# Patient Record
Sex: Male | Born: 1975 | Race: White | Hispanic: No | Smoking: Former smoker
Health system: Southern US, Community
[De-identification: ages and names within clinical notes are randomized; demographics above are authoritative.]

## PROBLEM LIST (undated history)

## (undated) DIAGNOSIS — F32A Depression, unspecified: Secondary | ICD-10-CM

## (undated) DIAGNOSIS — F329 Major depressive disorder, single episode, unspecified: Secondary | ICD-10-CM

## (undated) DIAGNOSIS — G4700 Insomnia, unspecified: Secondary | ICD-10-CM

## (undated) HISTORY — DX: Depression, unspecified: F32.A

## (undated) HISTORY — DX: Insomnia, unspecified: G47.00

## (undated) HISTORY — DX: Major depressive disorder, single episode, unspecified: F32.9

## (undated) HISTORY — PX: TYMPANOSTOMY TUBE PLACEMENT: SHX32

---

## 2018-06-12 ENCOUNTER — Encounter: Payer: Self-pay | Admitting: Emergency Medicine

## 2018-06-12 DIAGNOSIS — F988 Other specified behavioral and emotional disorders with onset usually occurring in childhood and adolescence: Secondary | ICD-10-CM

## 2018-06-12 DIAGNOSIS — G4700 Insomnia, unspecified: Secondary | ICD-10-CM

## 2018-06-26 ENCOUNTER — Ambulatory Visit: Payer: Commercial Managed Care - PPO | Admitting: Psychiatry

## 2018-06-26 ENCOUNTER — Encounter: Payer: Self-pay | Admitting: Psychiatry

## 2018-06-26 DIAGNOSIS — F325 Major depressive disorder, single episode, in full remission: Secondary | ICD-10-CM | POA: Diagnosis not present

## 2018-06-26 DIAGNOSIS — F900 Attention-deficit hyperactivity disorder, predominantly inattentive type: Secondary | ICD-10-CM | POA: Diagnosis not present

## 2018-06-26 DIAGNOSIS — R251 Tremor, unspecified: Secondary | ICD-10-CM | POA: Diagnosis not present

## 2018-06-26 NOTE — Progress Notes (Signed)
Steven RossettiRyan Parks [id number] 07/06/1976 42 y.o.  Subjective:   Patient ID:  Steven BaileyRyan Parks is a 42 y.o. (DOB 08/04/1976) male.  Chief Complaint:  Chief Complaint  Patient presents with  . ADHD  . Follow-up    depression and weaning of meds    [facility] to the office today for follow-up of history of major depression and weaning of duloxetine since th last visit in July from 60 mg to 0 about a month ago.  He hasn't noticed any significant mood changes.  Brief downs easily managed and not overwhelming.  Steven HooseCheryl Parks thinks he's doing ok too.    Notices his reactions seem sluggish and that his brain is slower than it should be.  Slow to react for example when his dog was falling.  This seems worse for unknown reasons.  Patient reports stable mood and denies depressed or irritable moods.  Patient denies any recent difficulty with anxiety.  Patient denies difficulty with sleep initiation or maintenance. Denies appetite disturbance.  Patient reports that energy and motivation have been good.  Patient denies any difficulty with concentration.  Patient denies any suicidal ideation.  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   It was initially helpful for his depression but then he lost the benefit.  He states the tremor persisted after discontinuing the Abilify. He then was started on Vraylar 1.5 mg which is the lowest dose in December 2016 which was effective and he took it for approximately 5 months and then he discontinued it..  Of note and his initial visit December 2015 it was noted the he had a blunted affect and that has been present ever since.  It is in fact 1 of the causes of marital distress and conflict that he has had over the years.  Good work function.   Review of Systems:  Review of Systems  Neurological: Positive for  tremors. Negative for dizziness, facial asymmetry, speech difficulty, weakness, light-headedness, numbness and headaches.  Psychiatric/Behavioral: Negative for agitation, behavioral problems, confusion, decreased concentration, dysphoric mood, hallucinations, self-injury, sleep disturbance and suicidal ideas. The patient is not nervous/anxious and is not hyperactive.     Medications: I have reviewed the patient's current medications.  Current Outpatient Medications  Medication Sig Dispense Refill  . zolpidem (AMBIEN) 10 MG tablet Take 10 mg by mouth. 1/2 qhs x 3 days and may increase to 1 qhs     No current facility-administered medications for this visit.     Medication Side Effects: None  Allergies:  Allergies  Allergen Reactions  . Allevess [Capsaicin-Menthol] Rash    History reviewed. No pertinent past medical history.  History reviewed. No pertinent family history.  Social History   Socioeconomic History  . Marital status: Married    Spouse name: Not on file  . Number of children: Not on file  . Years of education: Not on file  . Highest education level: Not on file  Occupational History  . Not on file  Social Needs  . Financial resource strain: Not on file  . Food insecurity:    Worry: Not on file    Inability: Not on file  . Transportation needs:    Medical: Not on file    Non-medical: Not on file  Tobacco Use  . Smoking status: Former Games developermoker  . Smokeless  tobacco: Never Used  Substance and Sexual Activity  . Alcohol use: Not on file  . Drug use: Not on file  . Sexual activity: Not on file  Lifestyle  . Physical activity:    Days per week: Not on file    Minutes per session: Not on file  . Stress: Not on file  Relationships  . Social connections:    Talks on phone: Not on file    Gets together: Not on file    Attends religious service: Not on file    Active member of club or organization: Not on file    Attends meetings of clubs or organizations: Not  on file    Relationship status: Not on file  . Intimate partner violence:    Fear of current or ex partner: Not on file    Emotionally abused: Not on file    Physically abused: Not on file    Forced sexual activity: Not on file  Other Topics Concern  . Not on file  Social History Narrative  . Not on file    Past Medical History, Surgical history, Social history, and Family history were reviewed and updated as appropriate.   Please see review of systems for further details on the patient's review from today.   Objective:   Physical Exam:  There were no vitals taken for this visit.  Physical Exam  Constitutional: He is oriented to person, place, and time. He appears well-developed. No distress.  Musculoskeletal: He exhibits no deformity.  Neurological: He is alert and oriented to person, place, and time. He displays tremor. Coordination and gait normal.  Decreased arm swing.  Tremor on left hand only.  Affect chronically restricted.  Psychiatric: His speech is normal and behavior is normal. Judgment and thought content normal. His mood appears not anxious. His affect is blunt. His affect is not angry, not labile and not inappropriate. Cognition and memory are normal. He does not exhibit a depressed mood. He expresses no homicidal and no suicidal ideation. He expresses no suicidal plans and no homicidal plans.  Insight intact. No auditory or visual hallucinations.  Chronically blunted affect.    Lab Review:  No results found for: NA, K, CL, CO2, GLUCOSE, BUN, CREATININE, CALCIUM, PROT, ALBUMIN, AST, ALT, ALKPHOS, BILITOT, GFRNONAA, GFRAA  No results found for: WBC, RBC, HGB, HCT, PLT, MCV, MCH, MCHC, RDW, LYMPHSABS, MONOABS, EOSABS, BASOSABS  No results found for: POCLITH, LITHIUM   No results found for: PHENYTOIN, PHENOBARB, VALPROATE, CBMZ   .res Assessment: Plan:    Tremor  Attention deficit hyperactivity disorder (ADHD), predominantly inattentive type  Major  depression in complete remission (HCC)  Greater than 50% of face to face time with patient was spent on counseling and coordination of care. We discussed fortunate resolution of his depression and success so far at being off of antidepressants.  We discussed that he could have relapse but that after the first 3 months off of antidepressants the relapse risk rate goes down.  Please contact us if he has recurrence of depression.  I am concerned about the combination of left hand tremor, blunted affect which preexisted any history of atypical antipsychotic treatment, significant decreased arm swing with gait, and overall slowness in movement and thought.  I think he should be evaluated by neurologist for possible early onset Parkinson's disease or some other neurologic condition.  Will refer to Dr. Lurena Joiner Tat.  He agrees to this consultation.  This appointment was 25 minutes  Follow-up here as  needed  Meredith Staggers MD, DFAPA Please see After Visit Summary for patient specific instructions.  No future appointments.  No orders of the defined types were placed in this encounter.     -------------------------------

## 2018-07-01 ENCOUNTER — Encounter: Payer: Self-pay | Admitting: Neurology

## 2018-07-05 NOTE — Progress Notes (Signed)
Steven Parks was seen today in the movement disorders clinic for neurologic consultation at the request of Cottle, Billey Co., MD.  The consultation is for the evaluation of tremor and to r/o PD.  The records that were made available to me were reviewed.    Tremor: Yes.     How long has it been going on? Seem to start after a very brief trial of Abilify in 2016.  Apparently took Abilify for less than 8 weeks.  Was then started on Vraylar in December, 2016 and took that for approximately 5 months  At rest or with activation?  rest  When is it noted the most?  Does affect ability to open soda cap.    Fam hx of tremor?  Yes.  , mother has vocal essential tremor and gets botox for that and mom has head tremor.  Father may have some tremor in the R hand, untreated/undiagnosed  Located where?  L hand/L arm.  Affected by caffeine:  No. (1-2 cups coffee/day)  Affected by alcohol:  Doesn't drink  Affected by stress:  Yes.    Affected by fatigue:  No., but doesn't seem to be present when sleeping  Affects ADL's (tying shoes, brushing teeth, etc):  Yes.   - slower than in the past  Tremor inducing meds:  No. - not currently  Other Specific Symptoms:  Voice: no change Sleep: sleeps well  Vivid Dreams:  No.  Acting out dreams:  No. .  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 Postural symptoms:  No.  Yusuf?  No. Bradykinesia symptoms: no bradykinesia noted Loss of smell:  No. - "always been dull" Loss of taste:  No. Urinary Incontinence:  No. Difficulty Swallowing:  No. Handwriting, micrographia: No. Trouble with ADL's:  No.  Trouble buttoning clothing: Yes.   Depression:  Yes.   but better with Dr. Clovis Pu Memory changes:  Yes.   Hallucinations:  No.  visual distortions: No. N/V:  No. Lightheaded:  No.  Syncope: No. Diplopia:  No. Dyskinesia:  No.  Neuroimaging of the brain has not previously been performed.    PREVIOUS MEDICATIONS: none to date  ALLERGIES:     Allergies  Allergen Reactions  . Allevess [Capsaicin-Menthol] Rash    CURRENT MEDICATIONS:  Outpatient Encounter Medications as of 07/09/2018  Medication Sig  . zolpidem (AMBIEN) 10 MG tablet Take 10 mg by mouth at bedtime as needed.    No facility-administered encounter medications on file as of 07/09/2018.     PAST MEDICAL HISTORY:   Past Medical History:  Diagnosis Date  . Depression   . Insomnia     PAST SURGICAL HISTORY:   Past Surgical History:  Procedure Laterality Date  . TYMPANOSTOMY TUBE PLACEMENT      SOCIAL HISTORY:   Social History   Socioeconomic History  . Marital status: Married    Spouse name: Not on file  . Number of children: Not on file  . Years of education: Not on file  . Highest education level: Not on file  Occupational History  . Not on file  Social Needs  . Financial resource strain: Not on file  . Food insecurity:    Worry: Not on file    Inability: Not on file  . Transportation needs:    Medical: Not on file    Non-medical: Not on file  Tobacco Use  . Smoking status: Former Smoker    Last attempt to quit: 08/14/2002  Years since quitting: 15.9  . Smokeless tobacco: Never Used  Substance and Sexual Activity  . Alcohol use: Not Currently  . Drug use: Never  . Sexual activity: Not on file  Lifestyle  . Physical activity:    Days per week: Not on file    Minutes per session: Not on file  . Stress: Not on file  Relationships  . Social connections:    Talks on phone: Not on file    Gets together: Not on file    Attends religious service: Not on file    Active member of club or organization: Not on file    Attends meetings of clubs or organizations: Not on file    Relationship status: Not on file  . Intimate partner violence:    Fear of current or ex partner: Not on file    Emotionally abused: Not on file    Physically abused: Not on file    Forced sexual activity: Not on file  Other Topics Concern  . Not on file  Social  History Narrative  . Not on file    FAMILY HISTORY:   Family Status  Relation Name Status  . Mother  Alive  . Father  Alive  . Sister  Alive  . Brother  Alive    ROS:  Review of Systems  Constitutional: Negative.   HENT: Negative.   Eyes: Negative.   Respiratory: Negative.   Cardiovascular: Negative.   Gastrointestinal: Negative.   Genitourinary: Negative.   Musculoskeletal: Negative.   Skin: Negative.   Neurological: Negative.   Endo/Heme/Allergies: Negative.     PHYSICAL EXAMINATION:    VITALS:   Vitals:   07/09/18 0940  BP: 130/88  Pulse: 84  SpO2: 98%  Weight: 223 lb (101.2 kg)  Height: '6\' 3"'  (1.905 m)    GEN:  The patient appears stated age and is in NAD. HEENT:  Normocephalic, atraumatic.  The mucous membranes are moist. The superficial temporal arteries are without ropiness or tenderness. CV:  RRR Lungs:  CTAB Neck/HEME:  There are no carotid bruits bilaterally. Skin: scars on L arm from prior cutting  Neurological examination:  Orientation: The patient is alert and oriented x3. Fund of knowledge is appropriate.  Recent and remote memory are intact.  Attention and concentration are normal.    Able to name objects and repeat phrases. Cranial nerves: There is good facial symmetry.  There is mild facial hypomimia with decreased blink.  Pupils are equal round and reactive to light bilaterally. Fundoscopic exam reveals clear margins bilaterally. Extraocular muscles are intact. The visual fields are full to confrontational testing. The speech is fluent and clear. Soft palate rises symmetrically and there is no tongue deviation. Hearing is intact to conversational tone. Sensation: Sensation is intact to light and pinprick throughout (facial, trunk, extremities). Vibration is intact at the bilateral big toe. There is no extinction with double simultaneous stimulation. There is no sensory dermatomal level identified. Motor: Strength is 5/5 in the bilateral upper and  lower extremities.   Shoulder shrug is equal and symmetric.  There is no pronator drift. Deep tendon reflexes: Deep tendon reflexes are 2/4 at the bilateral biceps, triceps, brachioradialis, patella and achilles. Plantar responses are downgoing bilaterally.  Movement examination: Tone: There is mild to moderate increased tone in the left upper and left lower extremities.  There is normal tone in the right upper and lower extremities. Abnormal movements: There is near constant, moderate left upper extremity resting tremor that increases with  distraction procedures. Coordination:  There is  decremation with RAM's, with any form of RAMS, including alternating supination and pronation of the forearm, hand opening and closing, finger taps, heel taps and toe taps on the left.  All rapid alternating movements on the right are normal. Gait and Station: The patient has no difficulty arising out of a deep-seated chair without the use of the hands. The patient's stride length is normal.  The patient has a negative pull test.      Labs: Last lab work was from May, 2018.  White blood cells were 8.0, hemoglobin 15.6, hematocrit 45.1 and platelets 305.  Sodium is 139, potassium 4.3, chloride 98, CO2 25, BUN 14, creatinine 1.1.  AST 26, ALT 43.  ASSESSMENT/PLAN:  1.  Parkinsonism.  I suspect that this does represent idiopathic Parkinson's disease.  Although the patient does have a history of exposure to antipsychotic medications, he has been many years since he was exposed and tardive parkinsonism is very rare.  -We discussed the diagnosis as well as pathophysiology of the disease.  We discussed treatment options as well as prognostic indicators.  Patient education was provided.  -We discussed that it used to be thought that levodopa would increase risk of melanoma but now it is believed that Parkinsons itself likely increases risk of melanoma. he is to get regular skin checks.  -Greater than 50% of the 60 minute  visit was spent in counseling answering questions and talking about what to expect now as well as in the future.  We talked about medication options as well as potential future surgical options.  We talked about safety in the home.  -We will do labs today, including chemistry and TSH   -MRI brain given age of onset  -We discussed community resources in the area including patient support groups and community exercise programs for PD and pt education was provided to the patient.  -Met with Education officer, museum today.  -Offered a second opinion and he will let us know if he would like to take [place] up on that.  He does live quite some distance away, but comes to [facility] for psychiatry.  -Talked in detail about medications.  Ultimately decided on pramipexole ER and will work up to 1.5 mg daily.  Discussed extensively risks, benefits, and side effects which included but were not limited to sleep attacks and compulsive behaviors.  Discussed in detail what compulsive behaviors were.  He is to let me know if he has any side effects with medication.  2.  Depression  -currently well controlled per patient.    -seeing Dr. Clovis Pu  3.  Follow up is anticipated in the next 4 months, sooner should new neurologic issues arise.   Cc:  Patient, No Pcp Per

## 2018-07-09 ENCOUNTER — Encounter: Payer: Self-pay | Admitting: Psychology

## 2018-07-09 ENCOUNTER — Ambulatory Visit: Payer: Self-pay | Admitting: Neurology

## 2018-07-09 ENCOUNTER — Encounter: Payer: Self-pay | Admitting: Neurology

## 2018-07-09 ENCOUNTER — Other Ambulatory Visit (INDEPENDENT_AMBULATORY_CARE_PROVIDER_SITE_OTHER): Payer: Commercial Managed Care - PPO

## 2018-07-09 ENCOUNTER — Ambulatory Visit: Payer: Commercial Managed Care - PPO | Admitting: Neurology

## 2018-07-09 VITALS — BP 130/88 | HR 84 | Ht 75.0 in | Wt 223.0 lb

## 2018-07-09 DIAGNOSIS — R251 Tremor, unspecified: Secondary | ICD-10-CM

## 2018-07-09 DIAGNOSIS — G20 Parkinson's disease: Secondary | ICD-10-CM

## 2018-07-09 DIAGNOSIS — R278 Other lack of coordination: Secondary | ICD-10-CM

## 2018-07-09 LAB — COMPREHENSIVE METABOLIC PANEL
AG Ratio: 2 (calc) (ref 1.0–2.5)
ALBUMIN MSPROF: 4.9 g/dL (ref 3.6–5.1)
ALT: 30 U/L (ref 9–46)
AST: 20 U/L (ref 10–40)
Alkaline phosphatase (APISO): 71 U/L (ref 40–115)
BUN: 18 mg/dL (ref 7–25)
CO2: 30 mmol/L (ref 20–32)
Calcium: 10.1 mg/dL (ref 8.6–10.3)
Chloride: 102 mmol/L (ref 98–110)
Creat: 0.95 mg/dL (ref 0.60–1.35)
Globulin: 2.4 g/dL (calc) (ref 1.9–3.7)
Glucose, Bld: 83 mg/dL (ref 65–99)
POTASSIUM: 4.2 mmol/L (ref 3.5–5.3)
Sodium: 140 mmol/L (ref 135–146)
TOTAL PROTEIN: 7.3 g/dL (ref 6.1–8.1)
Total Bilirubin: 0.8 mg/dL (ref 0.2–1.2)

## 2018-07-09 LAB — TSH: TSH: 0.73 m[IU]/L (ref 0.40–4.50)

## 2018-07-09 MED ORDER — PRAMIPEXOLE DIHYDROCHLORIDE ER 1.5 MG PO TB24: 1.0000 | ORAL_TABLET | Freq: Every day | ORAL | 2 refills | Status: DC

## 2018-07-09 MED ORDER — PRAMIPEXOLE DIHYDROCHLORIDE 0.125 MG PO TABS: ORAL_TABLET | ORAL | 0 refills | Status: DC

## 2018-07-09 NOTE — Progress Notes (Signed)
I met with the patient when he was in the clinic today.  We talked about resources for people that are diagnosed with young onset Parkinson's disease.  He lives close to the [place] area in [place].  However, he has a brother that lives here in [place] and a mother that lives in [place] as well as a wife.  He reported all of those contacts as his social support system.  He currently works full-time in [employer] and spends a lot of his day at his desk.  He likes to cycle outdoors, but is exploring other exercise options as it is getting colder outside.  He gave me permission to connect him to the [employer] He is interested in connecting.  I want him to get connected and know about what thgey are doing so he can participate especially for the social gatherings.  We talked a little bit about being connected with others who would understand.  In addition, we discussed exercise and resources for starting an exercise program.  I provided him with resources on exercise and Parkinson's disease.  In addition,  I provided him with my contact information should he have any questions or needs in the future.

## 2018-07-09 NOTE — Patient Instructions (Signed)
1. Start mirapex (pramipexole) as follows:  0.125 mg - 1 tablet three times per day for a week, then 2 tablets three times per day for a week and then fill the 1.5 mg tablet and take that, 1 pill daily  2. We have sent a referral to [facility] for your MRI and they will call you directly to schedule your appt. They are located at [address] If you need to contact them directly please call [phone].  3. Your provider has requested that you have labwork completed today. Please go to [facility] ([id number]) on the second floor of this building before leaving the office today. You do not need to check in. If you are not called within 15 minutes please check with the front desk.

## 2018-07-10 ENCOUNTER — Telehealth: Payer: Self-pay | Admitting: Neurology

## 2018-07-10 NOTE — Telephone Encounter (Signed)
Left message on machine for patient to call back.

## 2018-07-10 NOTE — Telephone Encounter (Signed)
-----   Message from [place]endale Chardonika K Patel, DO sent at 07/10/2018 10:45 AM EST ----- Please notify patient lab are within normal limits.  Thank you.

## 2018-07-15 ENCOUNTER — Telehealth: Payer: Self-pay | Admitting: Neurology

## 2018-07-15 DIAGNOSIS — G20 Parkinson's disease: Secondary | ICD-10-CM

## 2018-07-15 NOTE — Telephone Encounter (Signed)
Okay to send referral. 

## 2018-07-15 NOTE — Telephone Encounter (Signed)
Patient called and would like to have a second opinion with [facility]? He would like a referral sent to them. Please Call. Thanks

## 2018-07-15 NOTE — Telephone Encounter (Signed)
ok 

## 2018-07-16 ENCOUNTER — Ambulatory Visit: Payer: Self-pay | Admitting: Neurology

## 2018-07-16 ENCOUNTER — Encounter: Payer: Self-pay | Admitting: Neurology

## 2018-07-16 NOTE — Telephone Encounter (Signed)
Referral made to [facility]. They are booked until April 15, 2019. Letter mailed to patient with details of referral. Records fax[facility] to [facility] to [phone] with confirmation received.

## 2018-07-24 ENCOUNTER — Ambulatory Visit
Admission: RE | Admit: 2018-07-24 | Discharge: 2018-07-24 | Disposition: A | Discharge status: Home / Self Care | Payer: Commercial Managed Care - PPO | Source: Ambulatory Visit | Attending: Neurology | Admitting: Neurology

## 2018-07-24 DIAGNOSIS — R278 Other lack of coordination: Secondary | ICD-10-CM

## 2018-07-24 DIAGNOSIS — G20 Parkinson's disease: Secondary | ICD-10-CM

## 2018-07-24 DIAGNOSIS — R251 Tremor, unspecified: Secondary | ICD-10-CM

## 2018-07-25 ENCOUNTER — Telehealth: Payer: Self-pay | Admitting: Neurology

## 2018-07-25 NOTE — Telephone Encounter (Signed)
-----   Message from Octaviano Battyebecca S Tat, DO sent at 07/25/2018  7:34 AM EST ----- Let pt know that MRI is normal.  Reviewed personally

## 2018-07-25 NOTE — Telephone Encounter (Signed)
Left message on machine for patient to call back.

## 2018-07-25 NOTE — Telephone Encounter (Signed)
Patient called back and was made aware MR okay.

## 2018-08-15 ENCOUNTER — Telehealth: Payer: Self-pay | Admitting: Neurology

## 2018-08-15 NOTE — Telephone Encounter (Signed)
Spoke with patient and he states he feels like the Mirapex is helping, but not enough. Still having a lot of tremor and stiffness. He asked about increasing the dosage. I let him know that tremor is not always controlled by medication, but that Dr. Arbutus Leas would probably want to exam him before increasing the dose. He is currently on Mirapex 1.5 mg 1 QD. He has been on this for several weeks. He has a follow up scheduled at the end of February. Please advise if you would like to see him sooner to address?

## 2018-08-15 NOTE — Telephone Encounter (Signed)
Patient is calling in wanting to review dosages for the Mirapex. Please call him back at [phone]. Thanks!

## 2018-08-15 NOTE — Progress Notes (Deleted)
Steven Parks was seen today in the movement disorders clinic for follow-up of newly diagnosed Parkinson's disease.  He had an MRI of the brain since last visit, which was normal.  I have reviewed that personally.  He was started on pramipexole ER and has worked up to 1.5 mg daily.  He denies any compulsive behaviors.  He denies sleep attacks.  Denies hallucinations.  Denies [place].  He requested a work in appointment today as he wanted to increase the medication, but I wanted to see him first.  Patient reports that he is still having a lot of tremor and stiffness, although he does think that the pramipexole is helping some.  He has requested a second opinion at [facility].  He has an appointment with Dr. Westley Parks, but not until September.  PREVIOUS MEDICATIONS: none to date  ALLERGIES:   Allergies  Allergen Reactions  . Allevess [Capsaicin-Menthol] Rash    CURRENT MEDICATIONS:  Outpatient Encounter Medications as of 08/19/2018  Medication Sig  . pramipexole (MIRAPEX) 0.125 MG tablet 1 tablet three times per day for a week, then 2 tablets three times per day for a week and then fill the 1.5 mg tablet  . Pramipexole Dihydrochloride (MIRAPEX ER) 1.5 MG TB24 Take 1 tablet (1.5 mg total) by mouth daily.  Marland Kitchen. zolpidem (AMBIEN) 10 MG tablet Take 10 mg by mouth at bedtime as needed.    No facility-administered encounter medications on file as of 08/19/2018.     PAST MEDICAL HISTORY:   Past Medical History:  Diagnosis Date  . Depression   . Insomnia     PAST SURGICAL HISTORY:   Past Surgical History:  Procedure Laterality Date  . TYMPANOSTOMY TUBE PLACEMENT      SOCIAL HISTORY:   Social History   Socioeconomic History  . Marital status: Married    Spouse name: Not on file  . Number of children: Not on file  . Years of education: Not on file  . 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 Financial resource strain: Not on  file  . Food insecurity:    Worry: Not on file    Inability: Not on file  . Transportation needs:    Medical: Not on file    Non-medical: Not on file  Tobacco Use  . Smoking status: Former Smoker    Last attempt to quit: 08/14/2002    Years since quitting: 16.0  . Smokeless tobacco: Never Used  Substance and Sexual Activity  . Alcohol use: Not Currently  . Drug use: Never  . Sexual activity: Not on file  Lifestyle  . Physical activity:    Days per week: Not on file    Minutes per session: Not on file  . Stress: Not on file  Relationships  . Social connections:    Talks on phone: Not on file    Gets together: Not on file    Attends religious service: Not on file    Active member of club or organization: Not on file    Attends meetings of clubs or organizations: Not on file    Relationship status: Not on file  . Intimate partner violence:    Fear of current or ex partner: Not on file    Emotionally abused: Not on file    Physically abused: Not on file    Forced sexual activity: Not on file  Other Topics Concern  . Not  on file  Social History Narrative  . Not on file    FAMILY HISTORY:   Family Status  Relation Name Status  . Mother  Alive  . Father  Alive  . Sister  Alive  . Brother  Alive    ROS:  ROS  PHYSICAL EXAMINATION:    VITALS:   There were no vitals filed for this visit.  GEN:  The patient appears stated age and is in NAD. HEENT:  Normocephalic, atraumatic.  The mucous membranes are moist. The superficial temporal arteries are without ropiness or tenderness. CV:  RRR Lungs:  CTAB Neck/HEME:  There are no carotid bruits bilaterally. Skin: scars on L arm from prior cutting  Neurological examination:  Orientation: The patient is alert and oriented x3. Fund of knowledge is appropriate.  Recent and remote memory are intact.  Attention and concentration are normal.    Able to name objects and repeat phrases. Cranial nerves: There is good facial  symmetry.  There is mild facial hypomimia with decreased blink.  Pupils are equal round and reactive to light bilaterally. Fundoscopic exam reveals clear margins bilaterally. Extraocular muscles are intact. The visual fields are full to confrontational testing. The speech is fluent and clear. Soft palate rises symmetrically and there is no tongue deviation. Hearing is intact to conversational tone. Sensation: Sensation is intact to light and pinprick throughout (facial, trunk, extremities). Vibration is intact at the bilateral big toe. There is no extinction with double simultaneous stimulation. There is no sensory dermatomal level identified. Motor: Strength is 5/5 in the bilateral upper and lower extremities.   Shoulder shrug is equal and symmetric.  There is no pronator drift. Deep tendon reflexes: Deep tendon reflexes are 2/4 at the bilateral biceps, triceps, brachioradialis, patella and achilles. Plantar responses are downgoing bilaterally.  Movement examination: Tone: There is mild to moderate increased tone in the left upper and left lower extremities.  There is normal tone in the right upper and lower extremities. Abnormal movements: There is near constant, moderate left upper extremity resting tremor that increases with distraction procedures. Coordination:  There is  decremation with RAM's, with any form of RAMS, including alternating supination and pronation of the forearm, hand opening and closing, finger taps, heel taps and toe taps on the left.  All rapid alternating movements on the right are normal. Gait and Station: The patient has no difficulty arising out of a deep-seated chair without the use of the hands. The patient's stride length is normal.  The patient has a negative pull test.      Labs:    Chemistry      Component Value Date/Time   NA 140 07/09/2018 1055   K 4.2 07/09/2018 1055   CL 102 07/09/2018 1055   CO2 30 07/09/2018 1055   BUN 18 07/09/2018 1055   CREATININE 0.95  07/09/2018 1055      Component Value Date/Time   CALCIUM 10.1 07/09/2018 1055   AST 20 07/09/2018 1055   ALT 30 07/09/2018 1055   BILITOT 0.8 07/09/2018 1055     Lab Results  Component Value Date   TSH 0.73 07/09/2018     ASSESSMENT/PLAN:  1.  Parkinsonism.  I suspect that this does represent idiopathic Parkinson's disease.  Although the patient does have a history of exposure to antipsychotic medications, he has been many years since he was exposed and tardive parkinsonism is very rare.  -***Pramipexole ER  -Talked in detail about medications.  Ultimately decided on pramipexole ER  and will work up to 1.5 mg daily.  Discussed extensively risks, benefits, and side effects which included but were not limited to sleep attacks and compulsive behaviors.  Discussed in detail what compulsive behaviors were.  He is to let me know if he has any side effects with medication.  2.  Depression  -currently well controlled per patient.    -seeing Dr. Jennelle Human  3.  ***   Cc:  Patient, No Pcp Per

## 2018-08-15 NOTE — Telephone Encounter (Signed)
Left message on machine for patient to call back.  Appt made for Monday, awaiting call back to make patient aware and see if he can come this date/time.

## 2018-08-15 NOTE — Telephone Encounter (Signed)
Yes.  Need to re-evaluate him first before changing/adding medication.  There is a f/u open Monday if wants that or can wait until feb appt

## 2018-08-16 NOTE — Telephone Encounter (Signed)
Patient could not come to this appt. He was placed on wait list.

## 2018-08-19 ENCOUNTER — Ambulatory Visit: Payer: Commercial Managed Care - PPO | Admitting: Neurology

## 2018-09-24 NOTE — Progress Notes (Signed)
Steven Parks was seen today in the movement disorders clinic for follow-up of newly diagnosed Parkinson's disease.  This patient is accompanied in the office by his significant other who supplements the history.He was started on Mirapex ER last visit and has worked to 1.5 mg daily.  He doesn't think that it has helped.  He last took his medication last yesterday afternoon.    He has not had [place] since last visit.  No lightheadedness or near syncope.  No compulsive behaviors.  No hallucinations. He is not doing CV exercise but is doing weight resistance exercise.   He has requested a second opinion at [facility], and that referral was provided.  However, his appointment is not until September, 2020.  Patient did have an MRI of the brain since last visit.  I have personally reviewed that.  This was normal.  Reports that mood is at baseline.  States that he was depressed after our last visit, but has since resolved.  PREVIOUS MEDICATIONS: none to date  ALLERGIES:    Allergies  Allergen Reactions  . Allevess [Capsaicin-Menthol] Rash    CURRENT MEDICATIONS:  Outpatient Encounter Medications as of 10/02/2018  Medication Sig  . Pramipexole Dihydrochloride (MIRAPEX ER) 1.5 MG TB24 Take 1 tablet (1.5 mg total) by mouth daily.  . [DISCONTINUED] pramipexole (MIRAPEX) 0.125 MG tablet 1 tablet three times per day for a week, then 2 tablets three times per day for a week and then fill the 1.5 mg tablet  . [DISCONTINUED] zolpidem (AMBIEN) 10 MG tablet Take 10 mg by mouth at bedtime as needed.    No facility-administered encounter medications on file as of 10/02/2018.     PAST MEDICAL HISTORY:   Past Medical History:  Diagnosis Date  . Depression   . Insomnia     PAST SURGICAL HISTORY:   Past Surgical History:  Procedure Laterality Date  . TYMPANOSTOMY TUBE PLACEMENT      SOCIAL HISTORY:   Social History   Socioeconomic History  . Marital status: Married    Spouse name: Not on file  . Number of  children: Not on file  . Years of education: Not on file  . 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 Financial resource strain: Not on file  . Food insecurity:    Worry: Not on file    Inability: Not on file  . Transportation needs:    Medical: Not on file    Non-medical: Not on file  Tobacco Use  . Smoking status: Former Smoker    Last attempt to quit: 08/14/2002    Years since quitting: 16.1  . Smokeless tobacco: Never Used  Substance and Sexual Activity  . Alcohol use: Not Currently  . Drug use: Never  . Sexual activity: Not on file  Lifestyle  . Physical activity:    Days per week: Not on file    Minutes per session: Not on file  . Stress: Not on file  Relationships  . Social connections:    Talks on phone: Not on file    Gets together: Not on file    Attends religious service: Not on file    Active member of club or organization: Not on file    Attends meetings of clubs or organizations: Not on file    Relationship status: Not on file  . Intimate partner violence:    Fear of current or ex partner: Not on  file    Emotionally abused: Not on file    Physically abused: Not on file    Forced sexual activity: Not on file  Other Topics Concern  . Not on file  Social History Narrative  . Not on file    FAMILY HISTORY:   Family Status  Relation Name Status  . Mother  Alive  . Father  Alive  . Sister  Alive  . Brother  Alive    ROS:  Review of Systems  Constitutional: Negative.   HENT: Negative.   Eyes: Negative.   Respiratory: Negative.   Cardiovascular: Negative.   Gastrointestinal: Negative.   Skin: Negative.     PHYSICAL EXAMINATION:    VITALS:   Vitals:   10/02/18 1022  BP: 110/66  Pulse: 64  SpO2: 97%  Weight: 216 lb (98 kg)  Height: 6\' 3"  (1.905 m)    GEN:  The patient appears stated age and is in NAD. HEENT:  Normocephalic, atraumatic.  The mucous membranes are moist.  The superficial temporal arteries are without ropiness or tenderness. CV:  RRR Lungs:  CTAB Neck/HEME:  There are no carotid bruits bilaterally.' Skin: L arm scars from prior cutting  Neurological examination:  Orientation: The patient is alert and oriented x3. Cranial nerves: There is good facial symmetry. The speech is fluent and clear. Soft palate rises symmetrically and there is no tongue deviation. Hearing is intact to conversational tone. Sensation: Sensation is intact to light touch throughout Motor: Strength is 5/5 in the bilateral upper and lower extremities.   Shoulder shrug is equal and symmetric.  There is no pronator drift.  Movement examination: Tone: There is mild increased tone in the left upper and lower extremities.  Normal tone in the right. Abnormal movements: Near constant, moderate left upper extremity resting tremor. Coordination:  There is  decremation with RAM's, with most rapid alternating movements on the left. Gait and Station: The patient has no difficulty arising out of a deep-seated chair without the use of the hands. The patient's stride length is normal.    Labs:   Chemistry      Component Value Date/Time   NA 140 07/09/2018 1055   K 4.2 07/09/2018 1055   CL 102 07/09/2018 1055   CO2 30 07/09/2018 1055   BUN 18 07/09/2018 1055   CREATININE 0.95 07/09/2018 1055      Component Value Date/Time   CALCIUM 10.1 07/09/2018 1055   AST 20 07/09/2018 1055   ALT 30 07/09/2018 1055   BILITOT 0.8 07/09/2018 1055     Lab Results  Component Value Date   TSH 0.73 07/09/2018     ASSESSMENT/PLAN:  1.  Parkinsonism.  I suspect that this does represent idiopathic Parkinson's disease.  Although the patient does have a history of exposure to antipsychotic medications, he has been many years since he was exposed and tardive parkinsonism is very rare.  Discussed with the patient and his girlfriend today that I do not think that this is tardive parkinsonism,  although it could be.  Discussed that we could do a DaTscan, but think that the utility is probably fairly low right now.  -Continue pramipexole ER, 1.5 mg daily.  -After much discussion decided to add carbidopa/levodopa 25/100, 1 tablet 3 times per day.  Discussed extensively risk, benefits, and side effects.  Understanding was expressed.  Willingness to take medication was agreed upon.  -Recommended increased cardiovascular exercise.  -Patient's girlfriend had many questions and I answered them to the  best of my ability.  She asked about him having a potential atypical state.  I do not think that this is the case.  Discussed with her that he does not have any atypical features (early [place], early hallucinations, urinary incontinence, etc.), but it is early on in the disease and these things could develop within the first few years.  I do not think that this is the case, however.  -Patient has requested a second opinion at [facility], and now has an appointment in September, 2020.  I certainly have no objection to that, but I did tell him that I would not recommend him keeping two movement disorder specialists, so he will ultimately need to decide where he wants to stay.  [facility] much closer to his home, as he is driving a long way to come to my office. 2.  Depression  -currently well controlled per patient.    -seeing Dr. Jennelle Humanottle 3.  Follow up is anticipated in the next few months, sooner should new neurologic issues arise.  Much greater than 50% of this visit was spent in counseling and coordinating care.  Total face to face time:  35 min   Cc:  Patient, No Pcp Per

## 2018-10-02 ENCOUNTER — Encounter: Payer: Self-pay | Admitting: Neurology

## 2018-10-02 ENCOUNTER — Encounter

## 2018-10-02 ENCOUNTER — Ambulatory Visit: Payer: Commercial Managed Care - PPO | Admitting: Neurology

## 2018-10-02 VITALS — BP 110/66 | HR 64 | Ht 75.0 in | Wt 216.0 lb

## 2018-10-02 DIAGNOSIS — G20 Parkinson's disease: Secondary | ICD-10-CM | POA: Diagnosis not present

## 2018-10-02 MED ORDER — PRAMIPEXOLE DIHYDROCHLORIDE ER 1.5 MG PO TB24: 1.0000 | ORAL_TABLET | Freq: Every day | ORAL | 1 refills | Status: DC

## 2018-10-02 MED ORDER — CARBIDOPA-LEVODOPA 25-100 MG PO TABS: 1.0000 | ORAL_TABLET | Freq: Three times a day (TID) | ORAL | 1 refills | Status: DC

## 2018-10-02 NOTE — Patient Instructions (Signed)
1. Start Carbidopa Levodopa as follows:  Take 1/2 tablet three times daily, at least 30 minutes before meals, for one week  Then take 1/2 tablet in the morning, 1/2 tablet in the afternoon, 1 tablet in the evening, at least 30 minutes before meals, for one week  Then take 1/2 tablet in the morning, 1 tablet in the afternoon, 1 tablet in the evening, at least 30 minutes before meals, for one week  Then take 1 tablet three times daily, at least 30 minutes before meals  2. Stay on Pramipexole 1.5 mg daily.

## 2018-11-04 ENCOUNTER — Telehealth: Payer: Self-pay | Admitting: Neurology

## 2018-11-04 NOTE — Telephone Encounter (Signed)
Patient has some questions about the Caridopa levodopa dosage please call

## 2018-11-04 NOTE — Telephone Encounter (Signed)
He and I discussed in detail last visit that it may not help tremor (and may have levodopa resistant tremor as do 50% of patients).  I started it because he was rigid.  He is spreading them out a little far though.  I would move them closer together but don't want to increase dosage.

## 2018-11-04 NOTE — Telephone Encounter (Signed)
Patient made aware.

## 2018-11-04 NOTE — Telephone Encounter (Signed)
Spoke with patient. He states that Carbidopa Levodopa is not helping with tremor. I did explain that it is meant to help with other symptoms, not just tremor, but he is very focused on tremor control. He has been on a full dosage for a couple weeks. He is currently taking 1 tablet at 9-10 am, 1 at 3-4 pm, and 1 at 9-10 pm. He is taking his Pramipexole around 9-10 pm. His waking hours are 9-10 am until 12-1 am. I advised him to move dosages to the following times: 10 am, 2pm, and 6 pm. I also advised that Dr. Arbutus Leas doesn't often increase medication to control tremor, but I would send her the message.  Dr. Arbutus Leas - please advise.

## 2019-03-18 NOTE — Progress Notes (Signed)
Steven Parks was seen today in the movement disorders clinic for follow-up of newly diagnosed Parkinson's disease.  This patient is accompanied in the office by his significant other who supplements the history.He was started on Mirapex ER last visit and has worked to 1.5 mg daily.  He doesn't think that it has helped.  He last took his medication last yesterday afternoon.    He has not had Gir[place] since last visit.  No lightheadedness or near syncope.  No compulsive behaviors.  No hallucinations. He is not doing CV exercise but is doing weight resistance exercise.   He has requested a second opinion at [facility], and that referral was provided.  However, his appointment is not until September, 2020.  Patient did have an MRI of the brain since last visit.  I have personally reviewed that.  This was normal.  Reports that mood is at baseline.  States that he was depressed after our last visit, but has since resolved.  03/20/19 update: Patient seen today in follow-up for Parkinson's disease.  He is on pramipexole ER, 1.5 mg daily.  Last visit, we added carbidopa/levodopa 25/100 and worked to 1 tablet 3 times per day.  Feels that he is the same since last visit, including tremor.  Does note that if he misses dosage, tremor and stiffness are worse.  His walking has been good.  He is riding his bike 2 days per week (road) - 15 miles.  He does some weights a few days per week.    PREVIOUS MEDICATIONS: none to date  ALLERGIES:    Allergies  Allergen Reactions  . Allevess [Capsaicin-Menthol] Rash    CURRENT MEDICATIONS:  Outpatient Encounter Medications as of 03/20/2019  Medication Sig  . carbidopa-levodopa (SINEMET IR) 25-100 MG tablet Take 1 tablet by mouth 3 (three) times daily.  . Pramipexole Dihydrochloride (MIRAPEX ER) 1.5 MG TB24 Take 1 tablet (1.5 mg total) by mouth daily.   No facility-administered encounter medications on file as of 03/20/2019.     PAST MEDICAL HISTORY:   Past Medical History:   Diagnosis Date  . Depression   . Insomnia     PAST SURGICAL HISTORY:   Past Surgical History:  Procedure Laterality Date  . TYMPANOSTOMY TUBE PLACEMENT      SOCIAL HISTORY:   Social History   Socioeconomic History  . Marital status: Divorced    Spouse name: Not on file  . Number of children: 0  . Years of education: Not on file  . 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 Financial resource strain: Not on file  . Food insecurity    Worry: Not on file    Inability: Not on file  . Transportation needs    Medical: Not on file    Non-medical: Not on file  Tobacco Use  . Smoking status: Former Smoker    Quit date: 08/14/2002    Years since quitting: 16.6  . Smokeless tobacco: Never Used  Substance and Sexual Activity  . Alcohol use: Not Currently  . Drug use: Never  . Sexual activity: Not on file  Lifestyle  . Physical activity    Days per week: Not on file    Minutes per session: Not on file  . Stress: Not on file  Relationships  . Social Herbalist on phone: Not on file    Gets together: Not on file  Attends religious service: Not on file    Active member of club or organization: Not on file    Attends meetings of clubs or organizations: Not on file    Relationship status: Not on file  . Intimate partner violence    Fear of current or ex partner: Not on file    Emotionally abused: Not on file    Physically abused: Not on file    Forced sexual activity: Not on file  Other Topics Concern  . Not on file  Social History Narrative  . Not on file    FAMILY HISTORY:   Family Status  Relation Name Status  . Mother  Alive  . Father  Alive  . Sister  Alive  . Brother  Alive    ROS:  Review of Systems  Constitutional: Negative.   HENT: Negative.   Eyes: Negative.   Respiratory: Negative.   Cardiovascular: Negative.   Gastrointestinal: Negative.    Genitourinary: Negative.   Skin: Negative.     PHYSICAL EXAMINATION:    VITALS:   Vitals:   03/20/19 1119  BP: 127/81  Pulse: 69  Temp: 98.3 F (36.8 C)  SpO2: 99%  Weight: 210 lb 6.4 oz (95.4 kg)  Height: 6\' 3"  (1.905 m)    GEN:  The patient appears stated age and is in NAD. HEENT:  Normocephalic, atraumatic.  The mucous membranes are moist. The superficial temporal arteries are without ropiness or tenderness. CV:  RRR Lungs:  CTAB Neck/HEME:  There are no carotid bruits bilaterally.' Skin: L arm scars from prior cutting  Neurological examination:  Orientation: The patient is alert and oriented x3. Cranial nerves: There is good facial symmetry. The speech is fluent and clear. Soft palate rises symmetrically and there is no tongue deviation. Hearing is intact to conversational tone. Sensation: Sensation is intact to light touch throughout Motor: Strength is 5/5 in the bilateral upper and lower extremities.   Shoulder shrug is equal and symmetric.  There is no pronator drift.  Movement examination: Tone: There is mild increased tone in the left upper extremity (but better than last visit).  Normal tone in the right. Abnormal movements: Near constant, moderate left upper extremity resting tremor. Coordination:  There is no decremation, with any form of RAMS, including alternating supination and pronation of the forearm, hand opening and closing, finger taps, heel taps and toe taps. Gait and Station: The patient has no difficulty arising out of a deep-seated chair without the use of the hands. The patient's stride length is normal.    Labs:   Chemistry      Component Value Date/Time   NA 140 07/09/2018 1055   K 4.2 07/09/2018 1055   CL 102 07/09/2018 1055   CO2 30 07/09/2018 1055   BUN 18 07/09/2018 1055   CREATININE 0.95 07/09/2018 1055      Component Value Date/Time   CALCIUM 10.1 07/09/2018 1055   AST 20 07/09/2018 1055   ALT 30 07/09/2018 1055   BILITOT 0.8  07/09/2018 1055     Lab Results  Component Value Date   TSH 0.73 07/09/2018     ASSESSMENT/PLAN:  1.  Parkinsonism.  I suspect that this does represent idiopathic Parkinson's disease.  Although the patient does have a history of exposure to antipsychotic medications, he has been many years since he was exposed and tardive parkinsonism is very rare.  Discussed with the patient and his girlfriend today that I do not think that this is tardive  parkinsonism, although it could be.  Discussed that we could do a DaTscan, but think that the utility is probably fairly low right now.  -Continue pramipexole ER, 1.5 mg daily.  -Slightly increase carbidopa/levodopa 25/100, 2 tablets in the morning, 2 in the afternoon and 1 in the evening.  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  Discussed what this means.  -Discussed DBS.  Discussed the Early Stim study with the patient.  He does not think he is interested in looking at DBS right now, which I think is a reasonable approach, given that this is a fairly new diagnosis for him.  I would also like to see he has rigidity more improved with medication.  -Patient has appointment with Dr. Westley HummerSiddiqi on September 1 for a second opinion. 2.  Depression  -currently well controlled per patient.    -seeing Dr. Jennelle Humanottle but pt states that doesn't need f/u right now 3.  .Follow up is anticipated in the next 4-6 months, sooner should new neurologic issues arise.  Much greater than 50% of this visit was spent in counseling and coordinating care.  Total face to face time:  25 min   Cc:  Patient, No Pcp Per

## 2019-03-20 ENCOUNTER — Ambulatory Visit (INDEPENDENT_AMBULATORY_CARE_PROVIDER_SITE_OTHER): Payer: Commercial Managed Care - PPO | Admitting: Neurology

## 2019-03-20 ENCOUNTER — Other Ambulatory Visit: Payer: Self-pay

## 2019-03-20 ENCOUNTER — Encounter: Payer: Self-pay | Admitting: Neurology

## 2019-03-20 VITALS — BP 127/81 | HR 69 | Temp 98.3°F | Ht 75.0 in | Wt 210.4 lb

## 2019-03-20 DIAGNOSIS — G20 Parkinson's disease: Secondary | ICD-10-CM

## 2019-03-20 MED ORDER — CARBIDOPA-LEVODOPA 25-100 MG PO TABS: ORAL_TABLET | ORAL | 1 refills | Status: DC

## 2019-03-20 NOTE — Patient Instructions (Signed)
Increase carbidopa/levodopa 25/100, 2 in the AM, 2 in the afternoon, 1 in the evening

## 2019-04-08 ENCOUNTER — Other Ambulatory Visit: Payer: Self-pay | Admitting: Neurology

## 2019-04-08 NOTE — Telephone Encounter (Signed)
Requested Prescriptions   Pending Prescriptions Disp Refills  . Pramipexole Dihydrochloride 1.5 MG TB24 [Pharmacy Med Name: PRAMIPEXOLE DIHYDRO ER TAB30 1.5MG ] 90 tablet 3    Sig: TAKE 1 TABLET DAILY   Rx last filled:10/02/18 #90 1 refill  Pt last seen: 8/6/ 20  Follow up appt scheduled:09/01/2019

## 2019-08-05 ENCOUNTER — Encounter: Payer: Self-pay | Admitting: Neurology

## 2019-09-01 ENCOUNTER — Ambulatory Visit: Payer: Commercial Managed Care - PPO | Admitting: Neurology

## 2019-09-30 NOTE — Progress Notes (Deleted)
Virtual Visit via Video Note The purpose of this virtual visit is to provide medical care while limiting exposure to the novel coronavirus.    Consent was obtained for video visit:  {yes no:[id number]} Answered questions that patient had about telehealth interaction:  {yes no:314532} I discussed the limitations, risks, security and privacy concerns of performing an evaluation and management service by telemedicine. I also discussed with the patient that there may be a patient responsible charge related to this service. The patient expressed understanding and agreed to proceed.  Pt location: Home Physician Location: office Name of referring provider:  No ref. provider found I connected with [facility] at patients initiation/request on 10/03/2019 at  8:15 AM EST by video enabled telemedicine application and verified that I am speaking with the correct person using two identifiers. Pt MRN:  [id number] Pt DOB:  31-Mar-1976 Video Participants:  Steven Parks;  ***   History of Present Illness: *** Patient seen today in follow-up for Parkinson's disease.  My medical records as well as those from the movement disorder clinic at [facility] have been reviewed.  Patient saw Dr. Rubin Payor on 04/15/2019.  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  He did add propranolol, 20 mg twice daily for the levodopa resistant tremor, and felt that patient likely had a component of essential tremor as well.  He recommended genetic testing, just in case we ever had targeted therapy.  Patient did go back to [facility] on October 28 in their multidisciplinary movement clinic.  He had neuro cognitive testing on that date.  That was normal.  Pt denies Dossantos.  Pt denies lightheadedness, near syncope.  No hallucinations.  Mood has been good.  No compulsive behaviors.  No sleep attacks.  Current movement d/o meds:  ***Carbidopa/levodopa 25/100, 2 tablets in the morning, 2 in the afternoon and 1  in the evening Pramipexole 1.5 mg daily Propranolol, 20 mg twice per day.   Current Outpatient Medications on File Prior to Visit  Medication Sig Dispense Refill  . carbidopa-levodopa (SINEMET IR) 25-100 MG tablet 2 in the AM, 2 in the afternoon, 1 in the evening 450 tablet 1  . Pramipexole Dihydrochloride 1.5 MG TB24 TAKE 1 TABLET DAILY 90 tablet 1   No current facility-administered medications on file prior to visit.     Observations/Objective:   There were no vitals filed for this visit. GEN:  The patient appears stated age and is in NAD.  Neurological examination:  Orientation: The patient is alert and oriented x3. Cranial nerves: There is good facial symmetry. There is ***facial hypomimia.  The speech is fluent and clear. Soft palate rises symmetrically and there is no tongue deviation. Hearing is intact to conversational tone. Motor: Strength is at least antigravity x 4.   Shoulder shrug is equal and symmetric.  There is no pronator drift.  Movement examination: Tone: unable Abnormal movements: *** Coordination:  There is *** decremation with RAM's, *** Gait and Station: The patient has *** difficulty arising out of a deep-seated chair without the use of the hands. The patient's stride length is ***.      Assessment and Plan:   1.  Parkinson's disease  -Patient has sought second opinion at [facility] with Dr. Westley Hummer.  He concurred with the diagnosis.  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  -Continue carbidopa/levodopa 25/100, 2 tablets in the morning, 2 in the afternoon and 1 in the evening  -Continue pramipexole ER, 1.5 mg  daily  -Neurocognitive testing done at [facility] on 06/11/2019 demonstrated normal neurocognitive function. 2.  Tremor  -As above, patient has component of levodopa resistant tremor.  Dr. Linus Mako placed patient on propranolol for component of essential tremor as well.  Continue propranolol, 20 mg twice per day. 3.  History of  depression  -Well-controlled now.  Has seen Dr. Clovis Pu in the past  Follow Up Instructions:    -I discussed the assessment and treatment plan with the patient. The patient was provided an opportunity to ask questions and all were answered. The patient agreed with the plan and demonstrated an understanding of the instructions.   The patient was advised to call back or seek an in-person evaluation if the symptoms worsen or if the condition fails to improve as anticipated.    Total time spent on today's visit was ***minutes, including both face-to-face time and nonface-to-face time.  Time included that spent on review of records (prior notes available to me/labs/imaging if pertinent), discussing treatment and goals, answering patient's questions and coordinating care.   Alonza Bogus, DO

## 2019-10-03 ENCOUNTER — Telehealth: Payer: Commercial Managed Care - PPO | Admitting: Neurology

## 2019-10-03 ENCOUNTER — Other Ambulatory Visit: Payer: Self-pay

## 2019-10-10 NOTE — Progress Notes (Signed)
Virtual Visit via Phone Note (attempted to different websites for video and couldn't connect) The purpose of this virtual visit is to provide medical care while limiting exposure to the novel coronavirus.    Consent was obtained for phone visit:  Yes.   Answered questions that patient had about telehealth interaction:  Yes.   I discussed the limitations, risks, security and privacy concerns of performing an evaluation and management service by telemedicine. I also discussed with the patient that there may be a patient responsible charge related to this service. The patient expressed understanding and agreed to proceed.  Pt location: Home Physician Location: office Name of referring provider:  No ref. provider found I connected with [facility] at patients initiation/request on 10/14/2019 at  1:00 PM EST by telephone and verified that I am speaking with the correct person using two identifiers. Pt MRN:  [id number] Pt DOB:  October 10, 1975 Video Participants:  [facility];     History of Present Illness:  Patient seen today in follow-up for Parkinson's disease.  My medical records as well as those from the movement disorder clinic at [facility] have been reviewed.  Patient saw Dr. Rubin Payor on 04/15/2019.  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  He did add propranolol, 20 mg twice daily for the levodopa resistant tremor, and felt that patient likely had a component of essential tremor as well.  He recommended genetic testing, just in case we ever had targeted therapy.  Patient did go back to [facility] on October 28 in their multidisciplinary movement clinic.  He had neurocognitive testing on that date.  That was normal.  Pt denies Nicodemus.  Pt denies lightheadedness, near syncope.  No hallucinations.  Mood has been good - "better than they ever been."  No compulsive behaviors.  No sleep attacks.  He has since moved to [place] and is working as a Engineer, mining.  He likes  it a lot.  He likes the physical activity.  Hasn't otherwise been able to get exercise in.  Current movement d/o meds:  Carbidopa/levodopa 25/100, 2 tablets in the morning, 2 in the afternoon and 1 in the evening Pramipexole 1.5 mg daily Propranolol, 20 mg twice per day (pt states that it never got filled - some issue with [employer])   Current Outpatient Medications on File Prior to Visit  Medication Sig Dispense Refill  . carbidopa-levodopa (SINEMET IR) 25-100 MG tablet 2 in the AM, 2 in the afternoon, 1 in the evening 450 tablet 1  . Pramipexole Dihydrochloride 1.5 MG TB24 TAKE 1 TABLET DAILY 90 tablet 1   No current facility-administered medications on file prior to visit.     Observations/Objective:   Vitals:   10/14/19 0809  Weight: 208 lb (94.3 kg)  Height: 6\' 3"  (1.905 m)   Alert and oriented x 3 Speech is fluent and clear    Assessment and Plan:   1.  Parkinson's disease  -Patient has sought second opinion at [facility] with Dr. [facility].  He concurred with the diagnosis.  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  -Continue carbidopa/levodopa 25/100, 2 tablets in the morning, 2 in the afternoon and 1 in the evening  -Continue pramipexole ER, 1.5 mg daily  -Neurocognitive testing done at [facility] on 06/11/2019 demonstrated normal neurocognitive function.  -he asks about amantadine for tremor.  In my experience, the medication works really well for dyskinesia, but not for levodopa resistant tremor.  -discussed adding in exercise  2.  Tremor  -As above, patient has component of levodopa resistant tremor.  Dr. Linus Mako placed patient on propranolol for component of essential tremor as well.  Patient states that there was some miscommunication, and his insurance company never filled it.  We decided to go ahead and fill that today.  I sent a prescription for propranolol, 20 mg twice per day (which was the intended dose from Dr. Linus Mako).  R/B/SE were  discussed.  The opportunity to ask questions was given and they were answered to the best of my ability.  The patient expressed understanding and willingness to follow the outlined treatment protocols. 3.  History of depression  -Well-controlled now.  Has seen Dr. Clovis Pu in the past.  Just changed jobs and feels well.  Follow Up Instructions:    -I discussed the assessment and treatment plan with the patient. The patient was provided an opportunity to ask questions and all were answered. The patient agreed with the plan and demonstrated an understanding of the instructions.   The patient was advised to call back or seek an in-person evaluation if the symptoms worsen or if the condition fails to improve as anticipated.    Total time spent on today's visit was 12 minutes,   Wells Guiles Calissa Swenor, DO

## 2019-10-14 ENCOUNTER — Other Ambulatory Visit: Payer: Self-pay

## 2019-10-14 ENCOUNTER — Encounter: Payer: Self-pay | Admitting: Neurology

## 2019-10-14 ENCOUNTER — Telehealth: Payer: Self-pay | Admitting: Neurology

## 2019-10-14 ENCOUNTER — Telehealth (INDEPENDENT_AMBULATORY_CARE_PROVIDER_SITE_OTHER): Payer: Self-pay | Admitting: Neurology

## 2019-10-14 VITALS — Ht 75.0 in | Wt 208.0 lb

## 2019-10-14 DIAGNOSIS — G20 Parkinson's disease: Secondary | ICD-10-CM

## 2019-10-14 MED ORDER — PROPRANOLOL HCL 20 MG PO TABS: 20.0000 mg | ORAL_TABLET | Freq: Two times a day (BID) | ORAL | 1 refills | Status: DC

## 2019-10-14 NOTE — Telephone Encounter (Signed)
Patient called and left message with answering service regarding his 1:00 PM appt today. He said he never received a link for the visit? Can he reschedule the appt? Please Advise. Thank you

## 2019-10-15 NOTE — Telephone Encounter (Signed)
Left patient a message advising him to contact the office with questions about his appt ysterday. Per Dr Tat patient completed his appt.

## 2019-10-30 IMAGING — MR MR HEAD W/O CM
10 series · 48 of 48 positions shown · non-contrast
Comparison: None.

CLINICAL DATA: Parkinson's disease with left hand tremors.

EXAM:
MRI HEAD WITHOUT CONTRAST
TECHNIQUE: Multiplanar, multiecho pulse sequences of the brain and surrounding
structures were obtained without intravenous contrast.

[Series 2: T1 · sagittal · 5.0mm · 0.45mm/px · 3 of 21 slices shown]
[im 1/21]
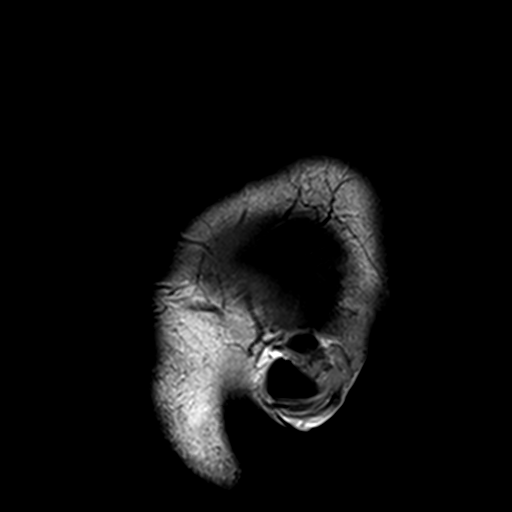
[im 11/21]
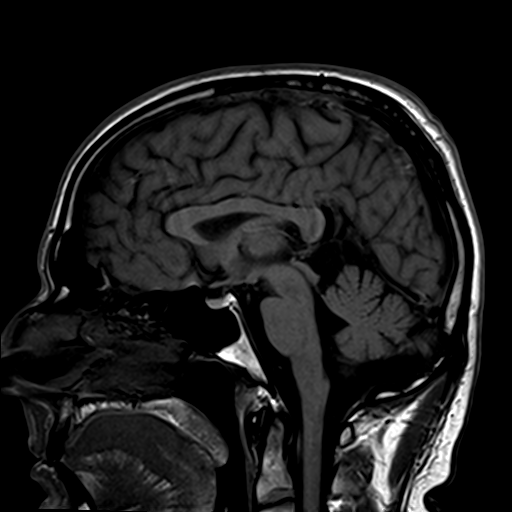
[im 21/21]
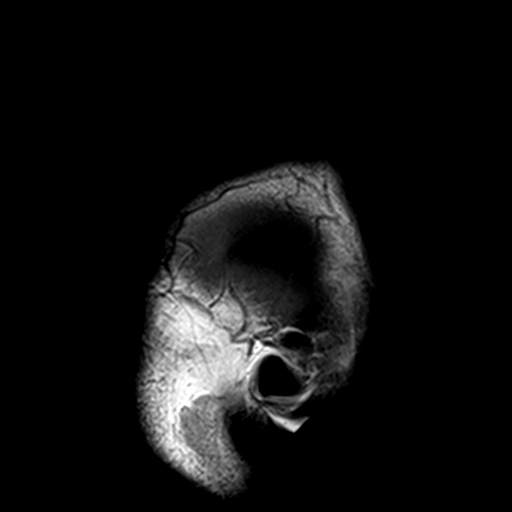

[Series 3: DWI · axial · 3.0mm · 1.80mm/px · z∈[-54,+99]mm · 9 of 104 slices shown (1 of 4)]
[im 1/104]
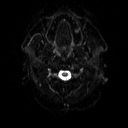
[im 13/104]
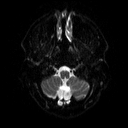
[im 26/104]
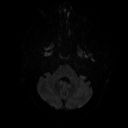
[im 39/104]
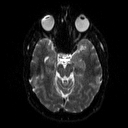
[im 52/104]
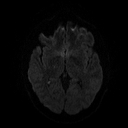
[im 65/104]
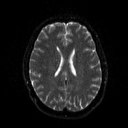
[im 78/104]
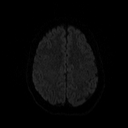
[im 91/104]
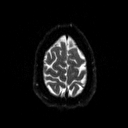
[im 104/104]
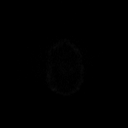

[Series 4: DWI · axial · 3.0mm · 1.80mm/px · z∈[-54,+99]mm · 4 of 49 slices shown (2 of 4)]
[im 1/49]
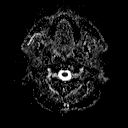
[im 17/49]
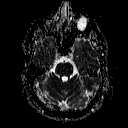
[im 33/49]
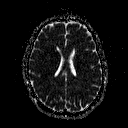
[im 49/49]
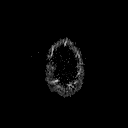

[Series 5: DWI · coronal · 5.0mm · 1.80mm/px · 6 of 71 slices shown (3 of 4)]
[im 1/71]
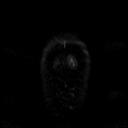
[im 15/71]
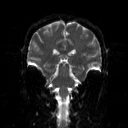
[im 29/71]
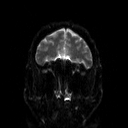
[im 43/71]
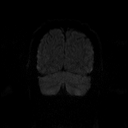
[im 57/71]
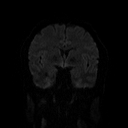
[im 71/71]
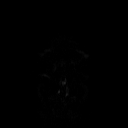

[Series 6: DWI · coronal · 5.0mm · 1.80mm/px · 3 of 36 slices shown (4 of 4)]
[im 1/36]
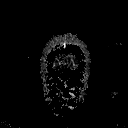
[im 18/36]
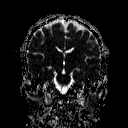
[im 36/36]
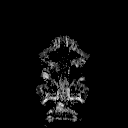

[Series 7: T2 · axial · 5.0mm · 0.51mm/px · z∈[-52,+109]mm · 2 of 24 slices shown (1 of 2)]
[im 1/24]
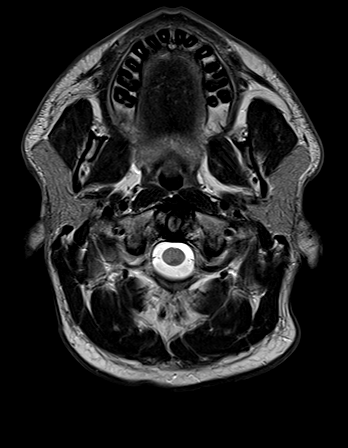
[im 24/24]
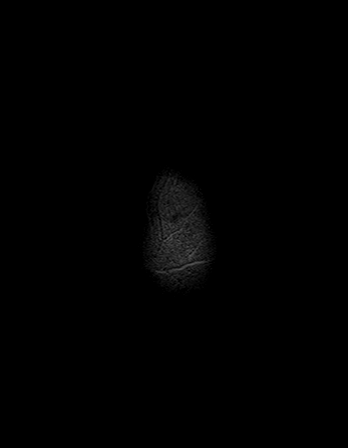

[Series 8: FLAIR · axial · 3.0mm · 0.45mm/px · z∈[-36,+117]mm · 3 of 34 slices shown]
[im 1/34]
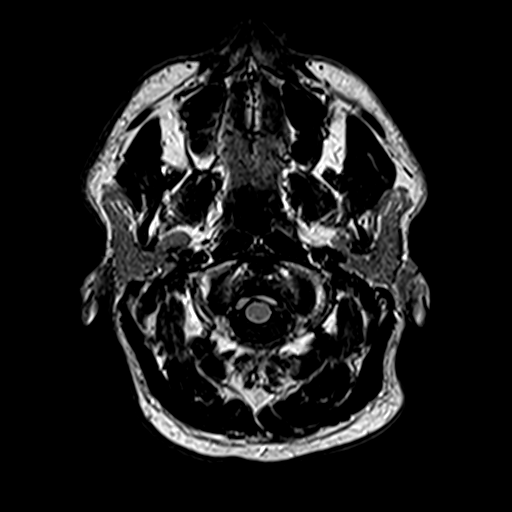
[im 17/34]
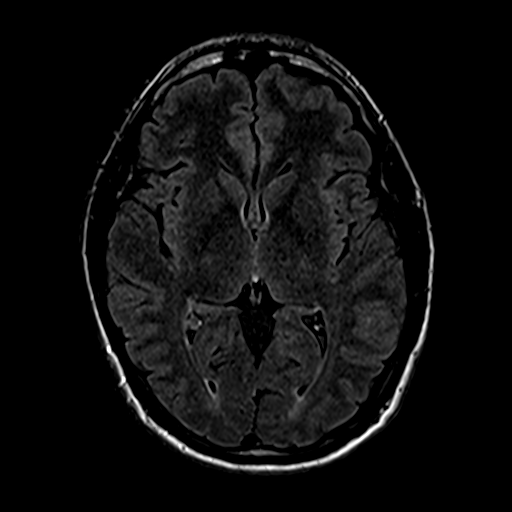
[im 34/34]
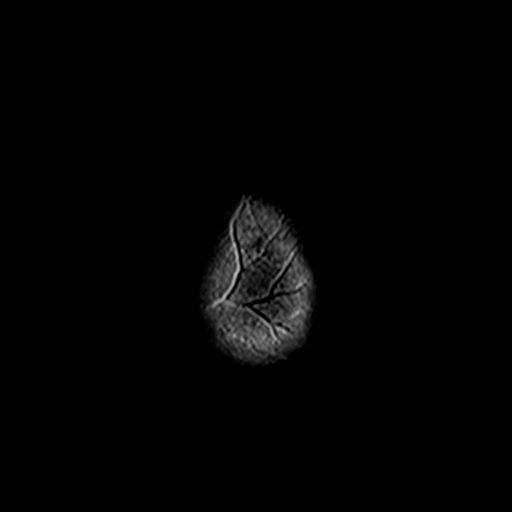

[Series 10: swi_images · axial · 5.0mm · 0.90mm/px · z∈[-32,+113]mm · 3 of 30 slices shown]
[im 1/30]
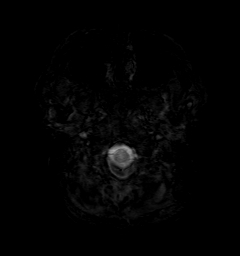
[im 15/30]
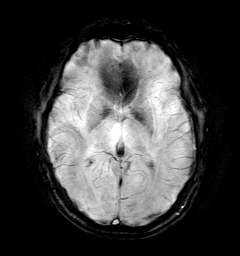
[im 30/30]
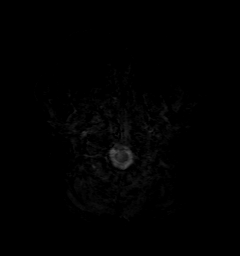

[Series 11: t1_mpr_tra · axial · 1.0mm · 0.71mm/px · z∈[-46,+103]mm · 13 of 144 slices shown]
[im 1/144]
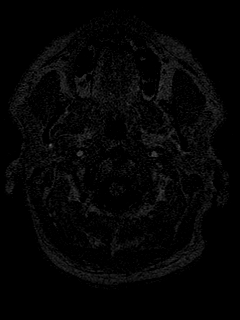
[im 12/144]
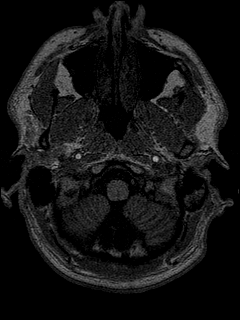
[im 24/144]
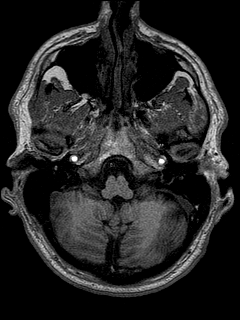
[im 36/144]
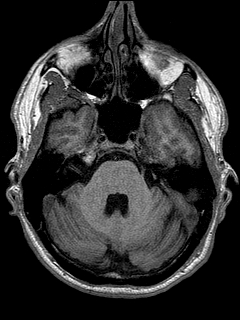
[im 48/144]
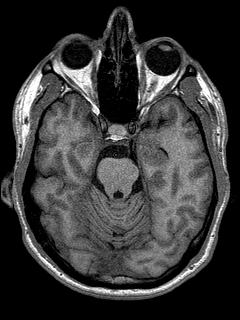
[im 60/144]
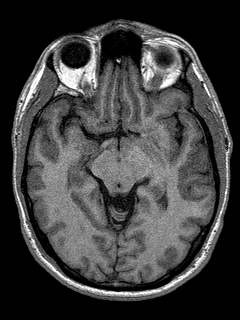
[im 72/144]
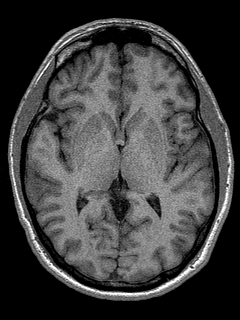
[im 84/144]
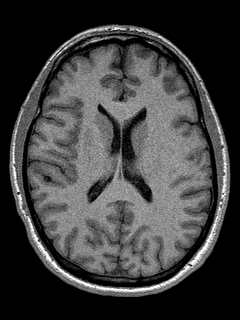
[im 96/144]
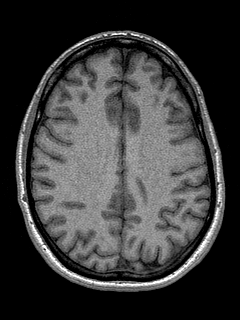
[im 108/144]
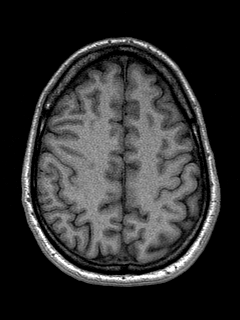
[im 120/144]
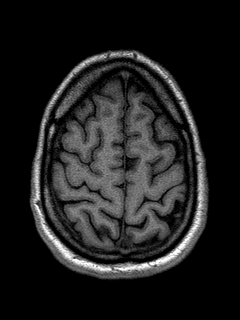
[im 132/144]
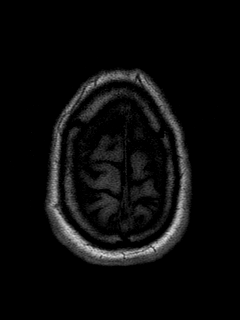
[im 144/144]
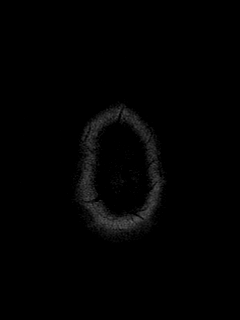

[Series 12: T2 · coronal · 5.0mm · 0.45mm/px · 2 of 28 slices shown (2 of 2)]
[im 1/28]
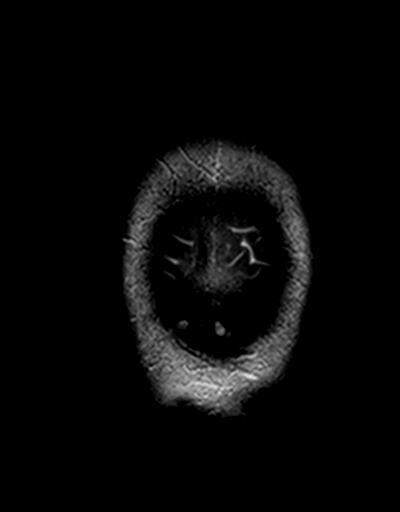
[im 28/28]
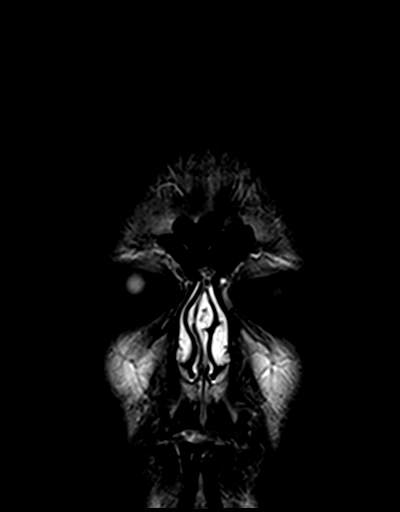

[48 of 48 positions shown; findings below may reference images not displayed]

FINDINGS: BRAIN: There is no acute infarct, acute hemorrhage, hydrocephalus or
extra-axial collection. The midline structures are normal. No
midline shift or other mass effect. There are no old infarcts. The
white matter signal is normal for the patient's age. The cerebral
and cerebellar volume are age-appropriate. Susceptibility-sensitive
sequences show no chronic microhemorrhage or superficial siderosis.

VASCULAR: Major intracranial arterial and venous sinus flow voids
are normal.

SKULL AND UPPER CERVICAL SPINE: Calvarial bone marrow signal is
normal. There is no skull base mass. Visualized upper cervical spine
and soft tissues are normal.

SINUSES/ORBITS: No fluid levels or advanced mucosal thickening. No
mastoid or middle ear effusion. The orbits are normal.
IMPRESSION: Normal MRI of the brain.

## 2019-11-18 ENCOUNTER — Other Ambulatory Visit: Payer: Self-pay | Admitting: Neurology

## 2019-11-26 ENCOUNTER — Telehealth: Payer: Self-pay | Admitting: Neurology

## 2019-11-26 ENCOUNTER — Other Ambulatory Visit: Payer: Self-pay

## 2019-11-26 MED ORDER — CARBIDOPA-LEVODOPA 25-100 MG PO TABS: ORAL_TABLET | ORAL | 0 refills | Status: DC

## 2019-11-26 MED ORDER — PRAMIPEXOLE DIHYDROCHLORIDE ER 1.5 MG PO TB24: 1.0000 | ORAL_TABLET | Freq: Every day | ORAL | 0 refills | Status: DC

## 2019-11-26 NOTE — Telephone Encounter (Signed)
Refill called in to [facility]

## 2019-11-26 NOTE — Telephone Encounter (Signed)
Pt called needing refills on carbidopa levodopa and pramipexole dihydochloride. [facility]ants [facility] [address] in [place].

## 2020-02-22 ENCOUNTER — Other Ambulatory Visit: Payer: Self-pay | Admitting: Neurology

## 2020-02-23 NOTE — Telephone Encounter (Signed)
Rx(s) sent to pharmacy electronically.  

## 2020-03-01 ENCOUNTER — Telehealth: Payer: Self-pay

## 2020-03-01 NOTE — Telephone Encounter (Signed)
Spoke with patient personally and he is not suicidal just really depressed and will see you on appt time, unless cancellation list. He is going to reach out to the Psychiatrist today as well. Just to give her an update.

## 2020-03-01 NOTE — Telephone Encounter (Signed)
FYI, Ex wife called to report that Dr.Laura Earlene Plater psychiatrist prescribed Cymbalta last week for patient, he is really depressed and no interest in doing anything. Has lost weight as well. She is going to contact MD back, but wanted you to know this. Also, she has noticed substained intense stare on occasional with this left eye. Tremors are worse, has follow up August 10,2021 with you. She wanted you to be aware to address this. Thank you

## 2020-03-01 NOTE — Telephone Encounter (Signed)
Line busy 03/01/2020.at 252

## 2020-03-01 NOTE — Telephone Encounter (Signed)
I don't think that we have a release on file for ex wife but sounds like he is seeing psychiatry (I believe he was referred to me by psychiatry but perhaps a different one).  Please call patient.  Confirm no SI/HI and make sure that he is continuing to follow with psychiatry

## 2020-03-18 NOTE — Progress Notes (Signed)
Assessment/Plan:   1.  Parkinsons Disease with levodopa resistant tremor  -Continue carbidopa/levodopa 25/100, 2/2/1  -Discontinue pramipexole ER, 1.5 mg daily and change to pramipexole 0.5 mg tid, purely because of insurance purposes  -Neurocognitive testing at [facility] in October, 2020 was normal  -Again discussed with him that he does not have PSP.  His ex-wife (they are getting back together) is a Advice worker and has asked me about this diagnosis previously, and the patient stated that she mentioned it to him as well.  She is worried he has this.  He does not have any atypical features.  He has sought a second opinion at [facility], and they agreed with the diagnosis of Parkinson's disease.  -Encouraged increased exercise.  2.  Levodopa resistant tremor  -Discussed propranolol.  There is some data that this can worsen depression.  He and I discussed this today but he thinks doing better now that he has moved and he and ex wife are together and reconciling  3.    Depression  -Patient under the care of nurse practitioner, Fransisca Kaufmann.  4.  Weight loss  -likely due to #3  -discussed that Parkinsons Disease doesn't usually cause weight loss unless much more advanced than his  Subjective:   Steven Parks was seen today in follow up for Parkinsons disease.  My previous records were reviewed prior to todays visit as well as outside records available to me. Pt denies Kelley.  Having little more early AM rigidity.  No cramping of feet or legs at bed.  Pt denies lightheadedness, near syncope.  Expresses concerns about weight loss.  No hallucinations.  Some occasional awakening of the paresthesias in night.  Propranolol has helped tremor.  Patient's ex-wife did call us since last visit to state that patient had increasing depression.  He did see psychiatry, however, and was prescribed Cymbalta the prior week.  We did call the patient back and he expressed that he was depressed, but had no  suicidal or homicidal ideation and that he was already seeing the psychiatrist.  He also stated that he was going to reach back out to the psychiatrist.  He is actually seeing a nurse practitioner when I looked it up, Fransisca Kaufmann, [facility]. Pt states that mood issues had to do with his job and travelling too much for work.  He states that this has changed and his commute is much better.  In addition, he is getting back together with his ex-wife.  Things seem to be going much better for him.  States that weight loss was during the "mood situation" and also his job is very physical and he has been moving around a lot.  States that his ex-wife is concerned that he has PSP and has noticed that he has a "staring look."  Current prescribed movement disorder medications: Carbidopa/levodopa 25/100, 2/2/1 Pramipexole ER, 1.5 mg daily Propranolol, 20 mg twice per day (started since our last visit -originally prescribed by Dr. Rubin Payor)   PREVIOUS MEDICATIONS: Sinemet, Mirapex and Propranolol  ALLERGIES:   Allergies  Allergen Reactions  . Aleve [Naproxen] Rash    CURRENT MEDICATIONS:  Outpatient Encounter Medications as of 03/23/2020  Medication Sig  . carbidopa-levodopa (SINEMET IR) 25-100 MG tablet TAKE 2 TABLETS BY MOUTH IN THE MORNING, 2 TABLETS IN THE AFTERNOON AND 1 TABLET IN THE EVENING  . DULoxetine (CYMBALTA) 30 MG capsule Take 30 mg by mouth daily.  . Pramipexole Dihydrochloride 1.5 MG TB24 Take 1  tablet (1.5 mg total) by mouth daily.  . propranolol (INDERAL) 20 MG tablet Take 1 tablet (20 mg total) by mouth 2 (two) times daily.   No facility-administered encounter medications on file as of 03/23/2020.    Objective:   PHYSICAL EXAMINATION:    VITALS:   Vitals:   03/23/20 1106  BP: 108/67  Pulse: (!) 54  SpO2: 99%  Weight: 192 lb (87.1 kg)  Height: 6\' 3"  (1.905 m)   Wt Readings from Last 3 Encounters:  03/23/20 192 lb (87.1 kg)  10/14/19 208 lb (94.3 kg)    03/20/19 210 lb 6.4 oz (95.4 kg)     GEN:  The patient appears stated age and is in NAD. HEENT:  Normocephalic, atraumatic.  The mucous membranes are moist. The superficial temporal arteries are without ropiness or tenderness. CV:  05/20/19.  regular Lungs:  CTAB Neck/HEME:  There are no carotid bruits bilaterally.  Neurological examination:  Orientation: The patient is alert and oriented x3. Cranial nerves: There is good facial symmetry with minimal facial hypomimia. The speech is fluent and clear. Soft palate rises symmetrically and there is no tongue deviation. Hearing is intact to conversational tone. Sensation: Sensation is intact to light touch throughout Motor: Strength is at least antigravity x4.  Movement examination: Tone: There is mild increased tone in the left upper extremity Abnormal movements: There is intermittent left upper extremity rest tremor Coordination:  There is no significant decremation with RAM's Gait and Station: The patient has no difficulty arising out of a deep-seated chair without the use of the hands. The patient's stride length is good with intermittent left upper extremity rest tremor.    I have reviewed and interpreted the following labs independently    Chemistry      Component Value Date/Time   NA 140 07/09/2018 1055   K 4.2 07/09/2018 1055   CL 102 07/09/2018 1055   CO2 30 07/09/2018 1055   BUN 18 07/09/2018 1055   CREATININE 0.95 07/09/2018 1055      Component Value Date/Time   CALCIUM 10.1 07/09/2018 1055   AST 20 07/09/2018 1055   ALT 30 07/09/2018 1055   BILITOT 0.8 07/09/2018 1055       No results found for: WBC, HGB, HCT, MCV, PLT  Lab Results  Component Value Date   TSH 0.73 07/09/2018     Total time spent on today's visit was 40 minutes, including both face-to-face time and nonface-to-face time.  Time included that spent on review of records (prior notes available to me/labs/imaging if pertinent), discussing treatment  and goals, answering patient's questions and coordinating care.  Cc:  Patient, No Pcp Per

## 2020-03-23 ENCOUNTER — Other Ambulatory Visit: Payer: Self-pay

## 2020-03-23 ENCOUNTER — Ambulatory Visit (INDEPENDENT_AMBULATORY_CARE_PROVIDER_SITE_OTHER): Payer: BLUE CROSS/BLUE SHIELD | Admitting: Neurology

## 2020-03-23 ENCOUNTER — Encounter: Payer: Self-pay | Admitting: Neurology

## 2020-03-23 VITALS — BP 108/67 | HR 54 | Ht 75.0 in | Wt 192.0 lb

## 2020-03-23 DIAGNOSIS — F330 Major depressive disorder, recurrent, mild: Secondary | ICD-10-CM

## 2020-03-23 DIAGNOSIS — R634 Abnormal weight loss: Secondary | ICD-10-CM | POA: Diagnosis not present

## 2020-03-23 DIAGNOSIS — G20 Parkinson's disease: Secondary | ICD-10-CM | POA: Diagnosis not present

## 2020-03-23 MED ORDER — CARBIDOPA-LEVODOPA 25-100 MG PO TABS: ORAL_TABLET | ORAL | 1 refills | Status: DC

## 2020-03-23 MED ORDER — PRAMIPEXOLE DIHYDROCHLORIDE 0.5 MG PO TABS: 0.5000 mg | ORAL_TABLET | Freq: Three times a day (TID) | ORAL | 1 refills | Status: DC

## 2020-03-23 NOTE — Patient Instructions (Signed)
The physicians and staff at  [facility] are committed to providing excellent care. You may receive a survey requesting feedback about your experience at our office. We strive to receive "very good" responses to the survey questions. If you feel that your experience would prevent you from giving the office a "very good " response, please contact our office to try to remedy the situation. We may be reached at [phone]. Thank you for taking the time out of your busy day to complete the survey.  

## 2020-04-15 ENCOUNTER — Ambulatory Visit: Payer: Self-pay | Admitting: Neurology

## 2020-04-24 ENCOUNTER — Other Ambulatory Visit: Payer: Self-pay | Admitting: Neurology

## 2020-09-17 ENCOUNTER — Ambulatory Visit: Payer: BLUE CROSS/BLUE SHIELD | Admitting: Neurology

## 2020-09-18 ENCOUNTER — Other Ambulatory Visit: Payer: Self-pay | Admitting: Neurology

## 2020-09-20 NOTE — Telephone Encounter (Signed)
Rx(s) sent to pharmacy electronically.  

## 2020-09-24 ENCOUNTER — Ambulatory Visit: Payer: BLUE CROSS/BLUE SHIELD | Admitting: Neurology

## 2020-10-21 NOTE — Progress Notes (Signed)
Virtual Visit Via Video   The purpose of this virtual visit is to provide medical care while limiting exposure to the novel coronavirus.    Consent was obtained for video visit:  Yes.   Answered questions that patient had about telehealth interaction:  Yes.   I discussed the limitations, risks, security and privacy concerns of performing an evaluation and management service by telemedicine. I also discussed with the patient that there may be a patient responsible charge related to this service. The patient expressed understanding and agreed to proceed.  Pt location: Home Physician Location: office Name of referring provider:  No ref. provider found I connected with [facility] at patients initiation/request on 10/22/2020 at  2:30 PM EST by video enabled telemedicine application and verified that I am speaking with the correct person using two identifiers. Pt MRN:  [id number] Pt DOB:  11/30/1975 Video Participants:  [facility];    Assessment/Plan:   1.  Parkinsons Disease with levodopa resistant tremor  -Continue carbidopa/levodopa 25/100, 2/2/1  -Last neurocognitive testing was October, 2020 at [facility] and was normal.  No concerns about this has changed.  -Patient is on propranolol, 20 mg twice per day.  He thinks it helps.  He understands that propranolol can potentially worsen depression, but he thinks that mood has been good right now.  2.  Depression  -Patient under care of a nurse practitioner for this.    Subjective:   Steven Parks was seen today in follow up for Parkinsons disease.  My previous records were reviewed prior to todays visit as well as outside records available to me.  Patient states that overall he is doing quite well.  If he misses his medication for a day, it is very obvious.  States that friends and coworkers have noticed that when he is on his medication, he actually seems to be doing better.  Is able to function as a Engineer, mining.  Pt denies Brach.  Pt denies  lightheadedness, near syncope.  No hallucinations.  Mood has been good.  Current prescribed movement disorder medications: Carbidopa/levodopa 25/100, 2 in the morning, 2 in the afternoon and 1 in the evening Pramipexole 0.5 mg 3 times per day (changed from the extended release purely because of cost) Propranolol, 20 mg twice per day  PREVIOUS MEDICATIONS: Sinemet, Mirapex and Propranolol  ALLERGIES:   Allergies  Allergen Reactions  . Aleve [Naproxen] Rash    CURRENT MEDICATIONS:  Outpatient Encounter Medications as of 10/22/2020  Medication Sig  . carbidopa-levodopa (SINEMET IR) 25-100 MG tablet TAKE 2 TABLETS BY MOUTH IN THE MORNING, 2 TABLETS IN THE AFTERNOON AND 1 TABLET IN THE EVENING  . DULoxetine (CYMBALTA) 30 MG capsule Take 30 mg by mouth daily.  . pramipexole (MIRAPEX) 0.5 MG tablet TAKE 1 TABLET (0.5 MG TOTAL) BY MOUTH 3 (THREE) TIMES DAILY.  Marland Kitchen propranolol (INDERAL) 20 MG tablet TAKE 1 TABLET BY MOUTH TWICE A DAY   No facility-administered encounter medications on file as of 10/22/2020.    Objective:   PHYSICAL EXAMINATION:    VITALS:  There were no vitals filed for this visit.  GEN:  The patient appears stated age and is in NAD.  Neurological examination:  Orientation: The patient is alert and oriented x3. Cranial nerves: There is good facial symmetry. There is no significant facial hypomimia.  The speech is fluent and clear. Soft palate rises symmetrically and there is no tongue deviation. Hearing is intact to conversational tone. Motor: Strength is at least antigravity  x 4.   Shoulder shrug is equal and symmetric.  There is no pronator drift.  Movement examination: Tone: unable Abnormal movements: None seen, but this is difficult to the video. Coordination:  There is no decremation with RAM's, 2 hand opening and closing, finger taps and the action of turning in a light bulb bilaterally. Gait and Station: Patient states that he really is unable to position the  camera and show me a walk.   I have reviewed and interpreted the following labs independently    Chemistry      Component Value Date/Time   NA 140 07/09/2018 1055   K 4.2 07/09/2018 1055   CL 102 07/09/2018 1055   CO2 30 07/09/2018 1055   BUN 18 07/09/2018 1055   CREATININE 0.95 07/09/2018 1055      Component Value Date/Time   CALCIUM 10.1 07/09/2018 1055   AST 20 07/09/2018 1055   ALT 30 07/09/2018 1055   BILITOT 0.8 07/09/2018 1055       No results found for: WBC, HGB, HCT, MCV, PLT  Lab Results  Component Value Date   TSH 0.73 07/09/2018     Follow up Instructions      -I discussed the assessment and treatment plan with the patient. The patient was provided an opportunity to ask questions and all were answered. The patient agreed with the plan and demonstrated an understanding of the instructions.   The patient was advised to call back or seek an in-person evaluation if the symptoms worsen or if the condition fails to improve as anticipated.    Total time spent on today's visit was 20 minutes, including both face-to-face time and nonface-to-face time.  Time included that spent on review of records (prior notes available to me/labs/imaging if pertinent), discussing treatment and goals, answering patient's questions and coordinating care.   Lurena Joiner Bernarr Longsworth, DO   Cc:  Patient, No Pcp Per

## 2020-10-22 ENCOUNTER — Telehealth (INDEPENDENT_AMBULATORY_CARE_PROVIDER_SITE_OTHER): Payer: Self-pay | Admitting: Neurology

## 2020-10-22 ENCOUNTER — Other Ambulatory Visit: Payer: Self-pay

## 2020-10-22 DIAGNOSIS — G20 Parkinson's disease: Secondary | ICD-10-CM

## 2020-11-01 ENCOUNTER — Other Ambulatory Visit: Payer: Self-pay | Admitting: Neurology

## 2020-12-27 ENCOUNTER — Other Ambulatory Visit: Payer: Self-pay | Admitting: Neurology

## 2021-01-29 ENCOUNTER — Other Ambulatory Visit: Payer: Self-pay | Admitting: Neurology

## 2021-04-02 ENCOUNTER — Other Ambulatory Visit: Payer: Self-pay | Admitting: Neurology

## 2021-04-26 ENCOUNTER — Ambulatory Visit: Payer: Self-pay | Admitting: Neurology

## 2021-05-02 ENCOUNTER — Other Ambulatory Visit: Payer: Self-pay | Admitting: Neurology

## 2021-05-02 DIAGNOSIS — G20 Parkinson's disease: Secondary | ICD-10-CM

## 2021-07-06 ENCOUNTER — Other Ambulatory Visit: Payer: Self-pay | Admitting: Neurology

## 2021-07-06 DIAGNOSIS — G20 Parkinson's disease: Secondary | ICD-10-CM

## 2021-07-23 ENCOUNTER — Other Ambulatory Visit: Payer: Self-pay | Admitting: Neurology

## 2021-07-23 DIAGNOSIS — G20 Parkinson's disease: Secondary | ICD-10-CM

## 2021-10-08 ENCOUNTER — Other Ambulatory Visit: Payer: Self-pay | Admitting: Neurology

## 2021-10-08 DIAGNOSIS — G20 Parkinson's disease: Secondary | ICD-10-CM

## 2021-10-08 DIAGNOSIS — G20A1 Parkinson's disease without dyskinesia, without mention of fluctuations: Secondary | ICD-10-CM

## 2021-10-10 ENCOUNTER — Other Ambulatory Visit: Payer: Self-pay
